# Patient Record
Sex: Female | Born: 1940 | Race: White | Hispanic: No | Marital: Married | Smoking: Former smoker
Health system: Southern US, Community
[De-identification: ages and names within clinical notes are randomized; demographics above are authoritative.]

## PROBLEM LIST (undated history)

## (undated) DIAGNOSIS — F419 Anxiety disorder, unspecified: Secondary | ICD-10-CM

## (undated) DIAGNOSIS — E785 Hyperlipidemia, unspecified: Secondary | ICD-10-CM

## (undated) DIAGNOSIS — J449 Chronic obstructive pulmonary disease, unspecified: Secondary | ICD-10-CM

## (undated) DIAGNOSIS — I2510 Atherosclerotic heart disease of native coronary artery without angina pectoris: Secondary | ICD-10-CM

## (undated) DIAGNOSIS — F32A Depression, unspecified: Secondary | ICD-10-CM

## (undated) DIAGNOSIS — I10 Essential (primary) hypertension: Secondary | ICD-10-CM

## (undated) DIAGNOSIS — F329 Major depressive disorder, single episode, unspecified: Secondary | ICD-10-CM

## (undated) HISTORY — PX: CERVICAL CONE BIOPSY: SUR198

## (undated) HISTORY — PX: OTHER SURGICAL HISTORY: SHX169

---

## 2017-10-06 ENCOUNTER — Other Ambulatory Visit: Payer: Self-pay

## 2017-10-06 ENCOUNTER — Emergency Department (HOSPITAL_BASED_OUTPATIENT_CLINIC_OR_DEPARTMENT_OTHER)
Admission: EM | Admit: 2017-10-06 | Discharge: 2017-10-06 | Disposition: A | Discharge status: Home / Self Care | Payer: Medicare Other | Attending: Emergency Medicine | Admitting: Emergency Medicine

## 2017-10-06 ENCOUNTER — Emergency Department (HOSPITAL_BASED_OUTPATIENT_CLINIC_OR_DEPARTMENT_OTHER): Payer: Medicare Other

## 2017-10-06 ENCOUNTER — Encounter (HOSPITAL_BASED_OUTPATIENT_CLINIC_OR_DEPARTMENT_OTHER): Payer: Self-pay | Admitting: *Deleted

## 2017-10-06 DIAGNOSIS — Y92239 Unspecified place in hospital as the place of occurrence of the external cause: Secondary | ICD-10-CM | POA: Insufficient documentation

## 2017-10-06 DIAGNOSIS — W502XXA Accidental twist by another person, initial encounter: Secondary | ICD-10-CM | POA: Insufficient documentation

## 2017-10-06 DIAGNOSIS — R110 Nausea: Secondary | ICD-10-CM | POA: Diagnosis not present

## 2017-10-06 DIAGNOSIS — J449 Chronic obstructive pulmonary disease, unspecified: Secondary | ICD-10-CM | POA: Insufficient documentation

## 2017-10-06 DIAGNOSIS — Y999 Unspecified external cause status: Secondary | ICD-10-CM | POA: Diagnosis not present

## 2017-10-06 DIAGNOSIS — R1013 Epigastric pain: Secondary | ICD-10-CM | POA: Diagnosis not present

## 2017-10-06 DIAGNOSIS — Z7902 Long term (current) use of antithrombotics/antiplatelets: Secondary | ICD-10-CM | POA: Diagnosis not present

## 2017-10-06 DIAGNOSIS — S59912A Unspecified injury of left forearm, initial encounter: Secondary | ICD-10-CM | POA: Diagnosis present

## 2017-10-06 DIAGNOSIS — S40022A Contusion of left upper arm, initial encounter: Secondary | ICD-10-CM | POA: Insufficient documentation

## 2017-10-06 DIAGNOSIS — Z87891 Personal history of nicotine dependence: Secondary | ICD-10-CM | POA: Insufficient documentation

## 2017-10-06 DIAGNOSIS — I10 Essential (primary) hypertension: Secondary | ICD-10-CM | POA: Insufficient documentation

## 2017-10-06 DIAGNOSIS — Y9301 Activity, walking, marching and hiking: Secondary | ICD-10-CM | POA: Insufficient documentation

## 2017-10-06 DIAGNOSIS — I2510 Atherosclerotic heart disease of native coronary artery without angina pectoris: Secondary | ICD-10-CM | POA: Insufficient documentation

## 2017-10-06 HISTORY — DX: Essential (primary) hypertension: I10

## 2017-10-06 HISTORY — DX: Atherosclerotic heart disease of native coronary artery without angina pectoris: I25.10

## 2017-10-06 HISTORY — DX: Anxiety disorder, unspecified: F41.9

## 2017-10-06 HISTORY — DX: Major depressive disorder, single episode, unspecified: F32.9

## 2017-10-06 HISTORY — DX: Hyperlipidemia, unspecified: E78.5

## 2017-10-06 HISTORY — DX: Depression, unspecified: F32.A

## 2017-10-06 HISTORY — DX: Chronic obstructive pulmonary disease, unspecified: J44.9

## 2017-10-06 MED ORDER — GI COCKTAIL ~~LOC~~
30.0000 mL | Freq: Once | ORAL | Status: AC
  Administered 2017-10-06: 30 mL via ORAL
  Filled 2017-10-06: qty 30

## 2017-10-06 MED ORDER — MORPHINE SULFATE (PF) 4 MG/ML IV SOLN
4.0000 mg | Freq: Once | INTRAVENOUS | Status: AC
  Administered 2017-10-06: 4 mg via INTRAMUSCULAR
  Filled 2017-10-06: qty 1

## 2017-10-06 MED ORDER — FAMOTIDINE 20 MG PO TABS
20.0000 mg | ORAL_TABLET | Freq: Once | ORAL | Status: AC
  Administered 2017-10-06: 20 mg via ORAL
  Filled 2017-10-06: qty 1

## 2017-10-06 MED ORDER — HYDROCODONE-ACETAMINOPHEN 5-325 MG PO TABS: 1.0000 | ORAL_TABLET | ORAL | 0 refills | Status: AC | PRN

## 2017-10-06 NOTE — ED Notes (Signed)
Pt tripped on walker/oxygen at MD office earlier today. Denies LOC.
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
## 2017-10-06 NOTE — ED Triage Notes (Signed)
Pt  c/o left arm injury x 1 hr ago, large hematoma to left forearm , pt is on blood thinner

## 2017-10-06 NOTE — Discharge Instructions (Signed)
Please apply ice to the area for the first 24 hours then switch to heat Continue to wear ACE wrap for compression Take pain medicine as needed Please follow up with your doctor

## 2019-04-10 IMAGING — DX DG FOREARM 2V*L*
2 series · 2 of 2 positions shown · non-contrast
Comparison: None.

CLINICAL DATA: 76-year-old who fell and injured the LEFT forearm at
her primary provider's office earlier today. Patient is
anticoagulated and has a visible mass involving the forearm. Initial
encounter.

EXAM:
LEFT FOREARM - 2 VIEW

[forearm ap]
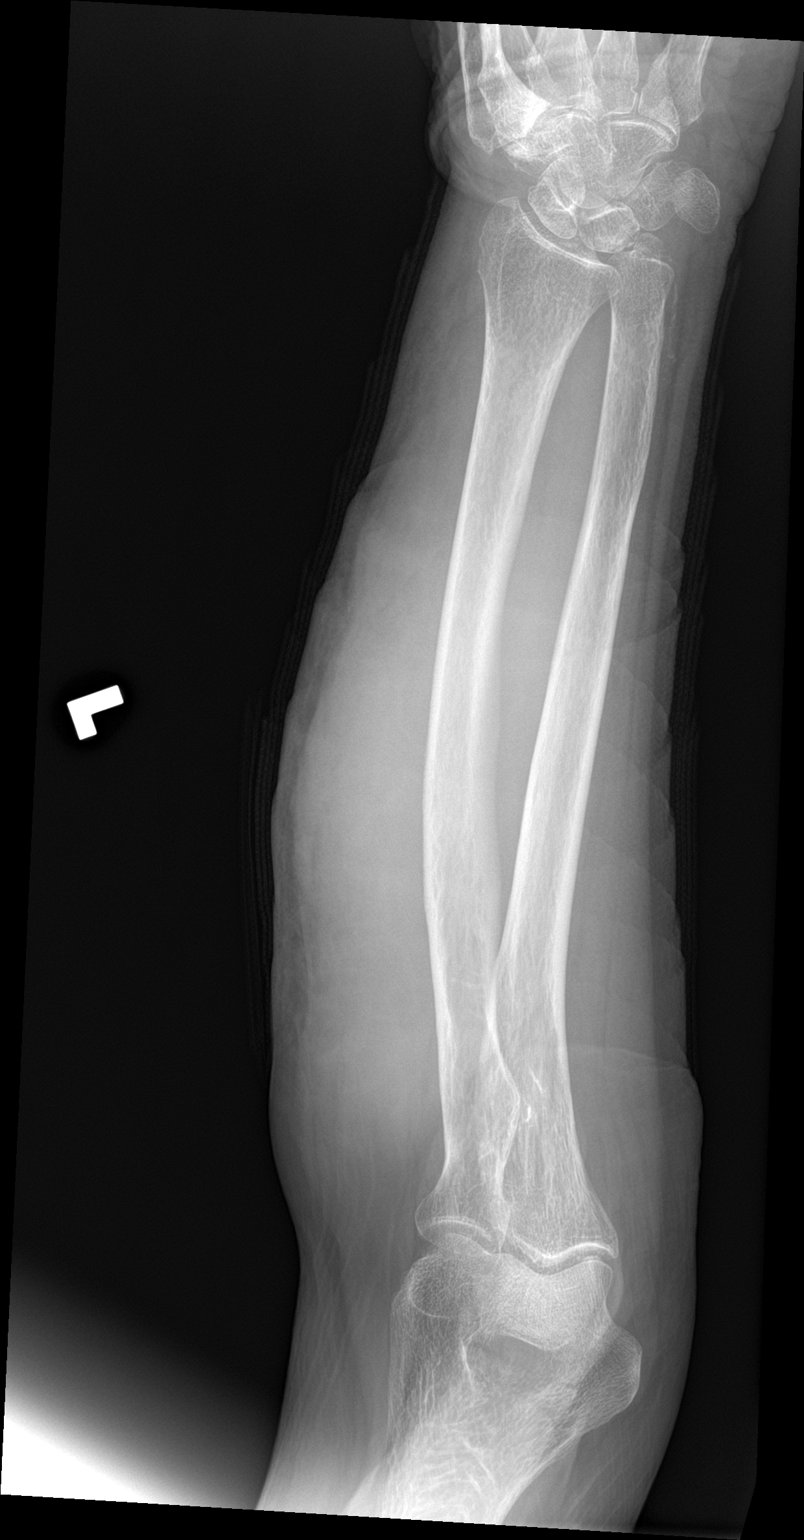

[forearm lat]
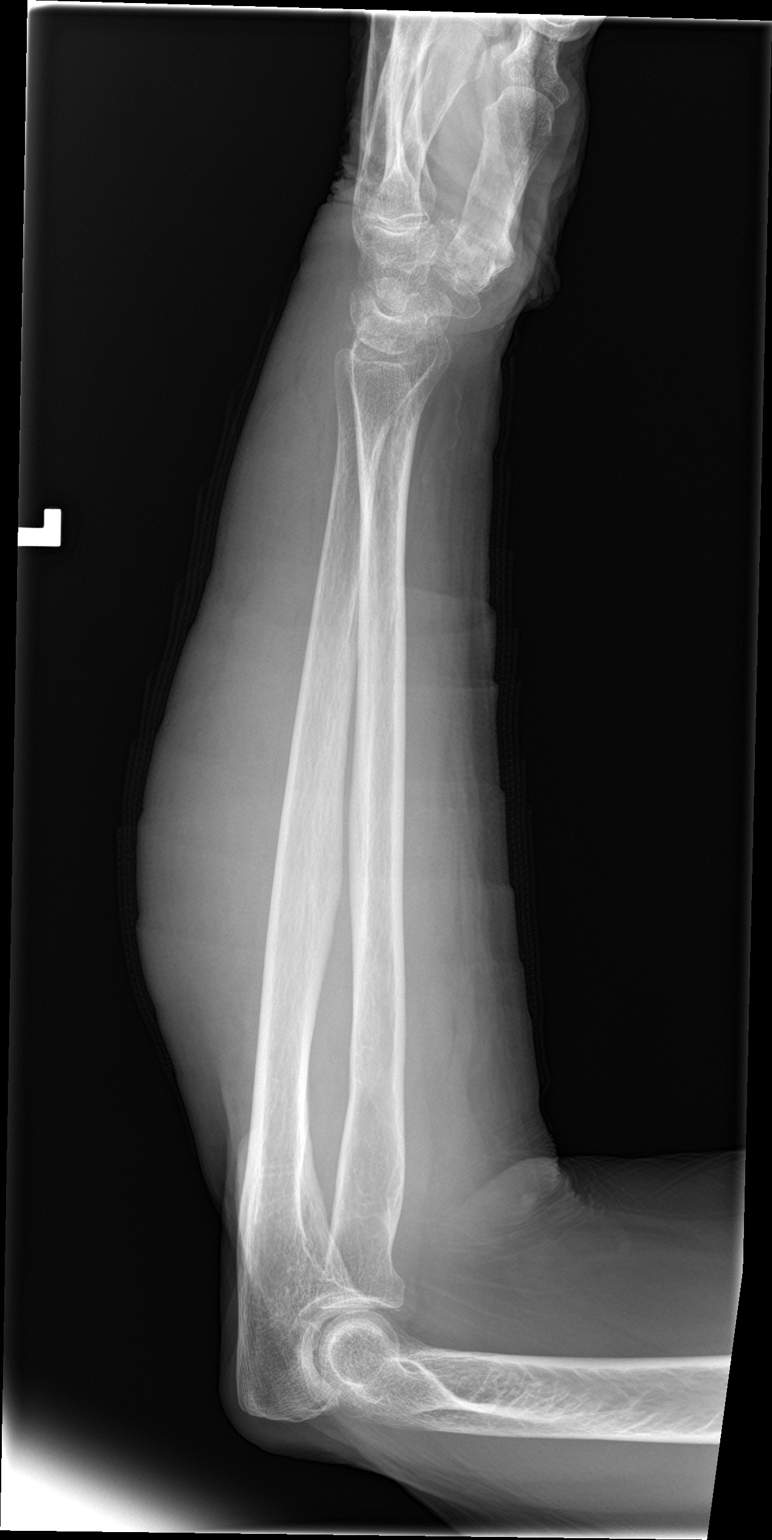

[2 of 2 positions shown; findings below may reference images not displayed]

FINDINGS: Soft tissue swelling/hematoma involving the DORSAL and LATERAL
forearm. No evidence of acute fracture involving the radius or ulna.
Mild osseous demineralization. Joint spaces well-preserved in the
visualized wrist and elbow joints.
IMPRESSION: 1. No osseous abnormality.
2. Large soft tissue hematoma involving the forearm.

## 2019-05-11 DEATH — deceased
